# Patient Record
Sex: Male | Born: 1980 | Race: Black or African American | Hispanic: No | Marital: Married | Smoking: Never smoker
Health system: Southern US, Community
[De-identification: ages and names within clinical notes are randomized; demographics above are authoritative.]

---

## 2002-02-08 ENCOUNTER — Emergency Department (HOSPITAL_COMMUNITY): Admission: EM | Admit: 2002-02-08 | Discharge: 2002-02-08 | Payer: Self-pay | Admitting: Emergency Medicine

## 2008-01-25 ENCOUNTER — Emergency Department (HOSPITAL_COMMUNITY): Admission: EM | Admit: 2008-01-25 | Discharge: 2008-01-25 | Payer: Self-pay | Admitting: Emergency Medicine

## 2009-02-14 ENCOUNTER — Ambulatory Visit (HOSPITAL_BASED_OUTPATIENT_CLINIC_OR_DEPARTMENT_OTHER): Admission: RE | Admit: 2009-02-14 | Discharge: 2009-02-14 | Payer: Self-pay | Admitting: Urology

## 2011-01-13 ENCOUNTER — Inpatient Hospital Stay (INDEPENDENT_AMBULATORY_CARE_PROVIDER_SITE_OTHER)
Admission: RE | Admit: 2011-01-13 | Discharge: 2011-01-13 | Disposition: A | Discharge status: Home / Self Care | Payer: 59 | Source: Ambulatory Visit | Attending: Family Medicine | Admitting: Family Medicine

## 2011-01-13 DIAGNOSIS — K5289 Other specified noninfective gastroenteritis and colitis: Secondary | ICD-10-CM

## 2011-01-13 LAB — POCT I-STAT, CHEM 8
Calcium, Ion: 1.1 mmol/L — ABNORMAL LOW (ref 1.12–1.32)
Creatinine, Ser: 1.1 mg/dL (ref 0.4–1.5)
Glucose, Bld: 116 mg/dL — ABNORMAL HIGH (ref 70–99)
HCT: 51 % (ref 39.0–52.0)
Hemoglobin: 17.3 g/dL — ABNORMAL HIGH (ref 13.0–17.0)

## 2011-01-15 ENCOUNTER — Ambulatory Visit (HOSPITAL_COMMUNITY): Payer: 59

## 2011-01-15 ENCOUNTER — Inpatient Hospital Stay (HOSPITAL_COMMUNITY)
Admission: EM | Admit: 2011-01-15 | Discharge: 2011-01-18 | DRG: 282 | Disposition: A | Discharge status: Home / Self Care | Payer: 59 | Source: Ambulatory Visit | Attending: Cardiology | Admitting: Cardiology

## 2011-01-15 ENCOUNTER — Inpatient Hospital Stay (INDEPENDENT_AMBULATORY_CARE_PROVIDER_SITE_OTHER)
Admission: RE | Admit: 2011-01-15 | Discharge: 2011-01-15 | Disposition: A | Discharge status: Home / Self Care | Payer: 59 | Source: Ambulatory Visit

## 2011-01-15 ENCOUNTER — Encounter (HOSPITAL_COMMUNITY): Payer: Self-pay | Admitting: Radiology

## 2011-01-15 DIAGNOSIS — Z23 Encounter for immunization: Secondary | ICD-10-CM

## 2011-01-15 DIAGNOSIS — E876 Hypokalemia: Secondary | ICD-10-CM | POA: Diagnosis present

## 2011-01-15 DIAGNOSIS — Z79899 Other long term (current) drug therapy: Secondary | ICD-10-CM

## 2011-01-15 DIAGNOSIS — I2109 ST elevation (STEMI) myocardial infarction involving other coronary artery of anterior wall: Principal | ICD-10-CM | POA: Diagnosis present

## 2011-01-15 DIAGNOSIS — I209 Angina pectoris, unspecified: Secondary | ICD-10-CM | POA: Diagnosis present

## 2011-01-15 DIAGNOSIS — I219 Acute myocardial infarction, unspecified: Secondary | ICD-10-CM

## 2011-01-15 DIAGNOSIS — E785 Hyperlipidemia, unspecified: Secondary | ICD-10-CM | POA: Diagnosis present

## 2011-01-15 LAB — POCT I-STAT, CHEM 8
Calcium, Ion: 1.06 mmol/L — ABNORMAL LOW (ref 1.12–1.32)
Creatinine, Ser: 0.9 mg/dL (ref 0.4–1.5)
Glucose, Bld: 113 mg/dL — ABNORMAL HIGH (ref 70–99)
HCT: 40 % (ref 39.0–52.0)
Hemoglobin: 13.6 g/dL (ref 13.0–17.0)
TCO2: 25 mmol/L (ref 0–100)

## 2011-01-15 LAB — CARDIAC PANEL(CRET KIN+CKTOT+MB+TROPI)
CK, MB: 41.8 ng/mL (ref 0.3–4.0)
CK, MB: 56.5 ng/mL (ref 0.3–4.0)
Relative Index: 3.7 — ABNORMAL HIGH (ref 0.0–2.5)
Total CK: 1118 U/L — ABNORMAL HIGH (ref 7–232)
Total CK: 1139 U/L — ABNORMAL HIGH (ref 7–232)
Troponin I: 6.41 ng/mL (ref 0.00–0.06)

## 2011-01-15 LAB — CBC
HCT: 38.6 % — ABNORMAL LOW (ref 39.0–52.0)
Hemoglobin: 12.5 g/dL — ABNORMAL LOW (ref 13.0–17.0)
MCH: 22.4 pg — ABNORMAL LOW (ref 26.0–34.0)
MCHC: 32.4 g/dL (ref 30.0–36.0)
RDW: 14.9 % (ref 11.5–15.5)

## 2011-01-15 LAB — LIPID PANEL
Cholesterol: 106 mg/dL (ref 0–200)
LDL Cholesterol: 55 mg/dL (ref 0–99)

## 2011-01-15 LAB — COMPREHENSIVE METABOLIC PANEL
ALT: 33 U/L (ref 0–53)
Alkaline Phosphatase: 15 U/L — ABNORMAL LOW (ref 39–117)
BUN: 5 mg/dL — ABNORMAL LOW (ref 6–23)
CO2: 27 mEq/L (ref 19–32)
GFR calc non Af Amer: >60 mL/min (ref >60)
Glucose, Bld: 115 mg/dL — ABNORMAL HIGH (ref 70–99)
Potassium: 3 mEq/L — ABNORMAL LOW (ref 3.5–5.1)
Sodium: 136 mEq/L (ref 135–145)
Total Protein: 6.1 g/dL (ref 6.0–8.3)

## 2011-01-15 LAB — PROTIME-INR
INR: 1.07 (ref 0.00–1.49)
Prothrombin Time: 14.1 seconds (ref 11.6–15.2)

## 2011-01-15 MED ORDER — IOHEXOL 350 MG/ML SOLN
100.0000 mL | Freq: Once | INTRAVENOUS | Status: AC | PRN
  Administered 2011-01-15: 100 mL via INTRAVENOUS

## 2011-01-16 LAB — COMPREHENSIVE METABOLIC PANEL
Alkaline Phosphatase: 15 U/L — ABNORMAL LOW (ref 39–117)
BUN: 5 mg/dL — ABNORMAL LOW (ref 6–23)
Creatinine, Ser: 1.02 mg/dL (ref 0.4–1.5)
Glucose, Bld: 115 mg/dL — ABNORMAL HIGH (ref 70–99)
Potassium: 3.8 mEq/L (ref 3.5–5.1)
Total Bilirubin: 0.3 mg/dL (ref 0.3–1.2)
Total Protein: 6.1 g/dL (ref 6.0–8.3)

## 2011-01-16 LAB — LACTATE DEHYDROGENASE: LDH: 219 U/L (ref 94–250)

## 2011-01-16 LAB — CARDIAC PANEL(CRET KIN+CKTOT+MB+TROPI)
CK, MB: 35 ng/mL (ref 0.3–4.0)
CK, MB: 17.2 ng/mL (ref 0.3–4.0)
Relative Index: 2.5 (ref 0.0–2.5)
Total CK: 1006 U/L — ABNORMAL HIGH (ref 7–232)
Troponin I: 6.83 ng/mL (ref 0.00–0.06)

## 2011-01-16 LAB — CBC
HCT: 39.5 % (ref 39.0–52.0)
MCH: 22.2 pg — ABNORMAL LOW (ref 26.0–34.0)
MCHC: 31.6 g/dL (ref 30.0–36.0)
MCV: 70.2 fL — ABNORMAL LOW (ref 78.0–100.0)
RDW: 15 % (ref 11.5–15.5)
WBC: 4.7 10*3/uL (ref 4.0–10.5)

## 2011-01-16 LAB — TSH: TSH: 1.026 u[IU]/mL (ref 0.350–4.500)

## 2011-01-18 LAB — BASIC METABOLIC PANEL
Calcium: 9 mg/dL (ref 8.4–10.5)
Creatinine, Ser: 1.14 mg/dL (ref 0.4–1.5)
GFR calc Af Amer: >60 mL/min (ref >60)
Sodium: 143 mEq/L (ref 135–145)

## 2011-01-19 LAB — ALDOLASE: Aldolase: 9 U/L — ABNORMAL HIGH (ref <=8.1)

## 2011-01-20 NOTE — H&P (Signed)
  NAMEEBEN, CHOINSKI          ACCOUNT NO.:  [id number]  MEDICAL RECORD NO.:  [id number]           PATIENT TYPE:  O  LOCATION:  CATH                         FACILITY:  [facility]  PHYSICIAN:  Italy Hilty, MD         DATE OF BIRTH:  [birth year]  DATE OF ADMISSION:  01/15/2011 DATE OF DISCHARGE:                             HISTORY & PHYSICAL   PRIMARY CARE DOCTOR:  L. Lupe Carney, MD  HISTORY OF PRESENT ILLNESS:  Mr. Hissong is a 30 year old male with no prior history of coronary artery disease.  He presented to Urgent Care this morning complaining of a chest pain and burning that radiated to his back.  His EKG suggested some anterior ST changes, it was interpreted as an anterior ST elevation MI.  He is transferred urgently to the cath lab for catheterization.  The patient says they developed substernal chest burning this morning around 8 a.m. when he got up.  It radiates to his back but does not particularly go down in his arms or jaw.  It is not worse with deep inspiration or lying flat.  He denies any associated shortness of breath or diaphoresis.  PAST MEDICAL HISTORY:  Remarkable for dyslipidemia.  PAST SURGICAL HISTORY:  He has had no major surgeries.  There is no history of diabetes.  HOME MEDICATIONS:  Simvastatin.  ALLERGIES:  He has no known drug allergies.  SOCIAL HISTORY:  He is married, he has five children total.  He works in [employer].  He is a nonsmoker.  He denies drug use.  FAMILY HISTORY:  Remarkable that his father had some type of heart problems.  The patient thinks at an early age.  REVIEW OF SYSTEMS:  The patient admits that earlier this week, he had nausea and vomiting and was seen at Urgent Care Wednesday prior to this admission and given IV fluids.  PHYSICAL EXAMINATION:  VITAL SIGNS:  Blood pressure 132/85, pulse 92, respirations 16. GENERAL:  He is a well-developed, well-nourished [place] male, anxious but in no acute  distress. HEENT: Normocephalic, atraumatic.  Extraocular movements are intact. Sclerae nonicteric. NECK:  Without JVD or bruit. CHEST: Clear to auscultation and percussion. CARDIAC:  Regular rate and rhythm without obvious murmur or rub. ABDOMEN:  Not distended, nontender. EXTREMITIES:  Without edema. NEURO:  Grossly intact. SKIN:  Cool and dry.  LABORATORY DATA:  Labs are pending.  IMPRESSION: 1. ST elevation myocardial infarction. 2. Recent viral gastrointestinal syndrome. 3. Treated dyslipidemia.  PLAN:  The patient is taken urgently to the cath lab by Dr. Rennis Golden.     Abelino Derrick, P.A.   ______________________________ [place] Hilty, MD    LKK/MEDQ  D:  01/15/2011  T:  01/15/2011  Job:  [id number]  Electronically Signed by Corine Shelter P.A. on 01/18/2011 12:25:34 PM Electronically Signed by Kirtland Bouchard. HILTY M.D. on 01/20/2011 08:11:31 AM

## 2011-01-20 NOTE — Discharge Summary (Signed)
  Willie Bonilla, Willie Bonilla          ACCOUNT NO.:  [id number]  MEDICAL RECORD NO.:  [id number]           PATIENT TYPE:  O  LOCATION:  2013                         FACILITY:  [facility]  PHYSICIAN:  Italy Miriah Maruyama, MD         DATE OF BIRTH:  09/29/1981  DATE OF ADMISSION:  01/15/2011 DATE OF DISCHARGE:  01/18/2011                              DISCHARGE SUMMARY   ADDENDUM  DISPOSITION:  The patient has been seen by Dr. Rennis Golden who feels he is stable for discharge on January 18, 2011, with repeat CMP and cardiac enzymes prior to followup in approximately 1 week.  He will be going home on aspirin, Lopressor, and Imdur.    ______________________________ Wilburt Finlay, PA   ______________________________ [place] Yohanna Tow, MD    BH/MEDQ  D:  01/18/2011  T:  01/19/2011  Job:  [id number]  Electronically Signed by Wilburt Finlay PA on 01/19/2011 03:10:58 PM Electronically Signed by Kirtland Bouchard. Vivan Vanderveer M.D. on 01/20/2011 08:13:01 AM

## 2011-01-20 NOTE — Procedures (Signed)
  Willie Bonilla, Willie Bonilla          ACCOUNT NO.:  [id number]  MEDICAL RECORD NO.:  [id number]           PATIENT TYPE:  O  LOCATION:  2013                         FACILITY:  [facility]  PHYSICIAN:  [place]aly Hilty, MD         DATE OF BIRTH:  11-05-1981  DATE OF PROCEDURE:  01/15/2011 DATE OF DISCHARGE:                           CARDIAC CATHETERIZATION   PROCEDURE:  Left heart catheterization for suspected ST-elevation myocardial infarction.  INDICATIONS:  Chest pain with evidence for 1-2 mm ST elevation at the J- point in V6, lead I, and aVL and 1 mm ST depression in leads III and aVF.  OPERATOR:  [place] Hilty, MD (Dr. Onnie Graham was in the middle of the intervention in another lab and I was asked to emergently perform the diagnostic portion of the STEMI case).  Door to needle time was less than 30 minutes.  PROCEDURE:  The patient was brought to the Cardiac Catheterization room after informed consent was obtained.  The patient was sterilely prepped and draped in the usual fashion.  He was given 2 mg of Versed and 50 mcg fentanyl for moderate sedation.  After quick procedural time-out and the radiation safety time-out, the area around the right femoral artery was anesthetized with 5 mL of 1% lidocaine.  The right femoral artery was accessed with straight needle and wire, and a 6-French right femoral access catheter was placed through which subsequent cardiac catheterization was performed with 6-French JL-4 and JR-4 as well as the no-torque catheter.  There were no acute complications and estimated blood loss was less than 10 mL.  FINDINGS:  Angiographically normal coronary arteries.  IMPRESSION: 1. Angiographically normal coronary arteries - this was not a ST-     elevation myocardial infarction. 2. Suspect early repolarization changes vs. coronary artery spasm.  PLAN:  Mr. Swatzell has had a recent upper respiratory infection with vomiting and I suspect may have perhaps chest  pain related to esophageal irritation or perhaps the chest wall pain secondary to viral illness, however, does not appear to be cardiac in nature.  His EKG changes are most likely consistent with early repolarization, however, could be due to coronary spasm.     [place] Hilty, MD    CH/MEDQ  D:  01/15/2011  T:  01/16/2011  Job:  [id number]  Electronically Signed by K. HILTY M.D. on 01/20/2011 08:12:55 AM

## 2011-02-01 ENCOUNTER — Encounter (HOSPITAL_COMMUNITY): Payer: 59

## 2011-02-03 ENCOUNTER — Encounter (HOSPITAL_COMMUNITY): Payer: 59

## 2011-02-05 ENCOUNTER — Encounter (HOSPITAL_COMMUNITY): Payer: 59

## 2011-02-08 ENCOUNTER — Encounter (HOSPITAL_COMMUNITY): Payer: 59

## 2011-02-10 ENCOUNTER — Encounter (HOSPITAL_COMMUNITY): Payer: 59 | Attending: Internal Medicine

## 2011-02-10 DIAGNOSIS — Z5189 Encounter for other specified aftercare: Secondary | ICD-10-CM | POA: Insufficient documentation

## 2011-02-10 DIAGNOSIS — I2109 ST elevation (STEMI) myocardial infarction involving other coronary artery of anterior wall: Secondary | ICD-10-CM | POA: Insufficient documentation

## 2011-02-10 DIAGNOSIS — I209 Angina pectoris, unspecified: Secondary | ICD-10-CM | POA: Insufficient documentation

## 2011-02-10 DIAGNOSIS — Z79899 Other long term (current) drug therapy: Secondary | ICD-10-CM | POA: Insufficient documentation

## 2011-02-10 DIAGNOSIS — E785 Hyperlipidemia, unspecified: Secondary | ICD-10-CM | POA: Insufficient documentation

## 2011-02-12 ENCOUNTER — Encounter (HOSPITAL_COMMUNITY): Payer: 59

## 2011-02-15 ENCOUNTER — Encounter (HOSPITAL_COMMUNITY): Payer: 59

## 2011-02-17 ENCOUNTER — Encounter (HOSPITAL_COMMUNITY): Payer: 59

## 2011-02-17 LAB — POCT HEMOGLOBIN-HEMACUE: Hemoglobin: 13.8 g/dL (ref 13.0–17.0)

## 2011-02-19 ENCOUNTER — Encounter (HOSPITAL_COMMUNITY): Payer: 59

## 2011-02-22 ENCOUNTER — Encounter (HOSPITAL_COMMUNITY): Payer: 59

## 2011-02-24 ENCOUNTER — Encounter (HOSPITAL_COMMUNITY): Payer: 59

## 2011-02-26 ENCOUNTER — Encounter (HOSPITAL_COMMUNITY): Payer: 59

## 2011-03-01 ENCOUNTER — Encounter (HOSPITAL_COMMUNITY): Payer: 59

## 2011-03-03 ENCOUNTER — Encounter (HOSPITAL_COMMUNITY): Payer: 59

## 2011-03-05 ENCOUNTER — Encounter (HOSPITAL_COMMUNITY): Payer: 59

## 2011-03-08 ENCOUNTER — Encounter (HOSPITAL_COMMUNITY): Payer: 59

## 2011-03-10 ENCOUNTER — Encounter (HOSPITAL_COMMUNITY): Payer: 59 | Attending: Internal Medicine

## 2011-03-10 DIAGNOSIS — I209 Angina pectoris, unspecified: Secondary | ICD-10-CM | POA: Insufficient documentation

## 2011-03-10 DIAGNOSIS — I2109 ST elevation (STEMI) myocardial infarction involving other coronary artery of anterior wall: Secondary | ICD-10-CM | POA: Insufficient documentation

## 2011-03-10 DIAGNOSIS — Z79899 Other long term (current) drug therapy: Secondary | ICD-10-CM | POA: Insufficient documentation

## 2011-03-10 DIAGNOSIS — E785 Hyperlipidemia, unspecified: Secondary | ICD-10-CM | POA: Insufficient documentation

## 2011-03-10 DIAGNOSIS — Z5189 Encounter for other specified aftercare: Secondary | ICD-10-CM | POA: Insufficient documentation

## 2011-03-12 ENCOUNTER — Encounter (HOSPITAL_COMMUNITY): Payer: 59

## 2011-03-15 ENCOUNTER — Encounter (HOSPITAL_COMMUNITY): Payer: 59

## 2011-03-17 ENCOUNTER — Encounter (HOSPITAL_COMMUNITY): Payer: 59

## 2011-03-19 ENCOUNTER — Encounter (HOSPITAL_COMMUNITY): Payer: 59

## 2011-03-22 ENCOUNTER — Encounter (HOSPITAL_COMMUNITY): Payer: 59

## 2011-03-23 NOTE — Op Note (Signed)
Willie Bonilla, Willie Bonilla          ACCOUNT NO.:  [id number]   MEDICAL RECORD NO.:  [id number]          PATIENT TYPE:  AMB   LOCATION:  [facility]                         FACILITY:  [facility]   PHYSICIAN:  Maretta Bees. Vonita Moss, M.D.DATE OF BIRTH:  06/21/1981   DATE OF PROCEDURE:  02/14/2009  DATE OF DISCHARGE:                               OPERATIVE REPORT   PREOPERATIVE DIAGNOSIS:  Phimosis.   POSTOP DIAGNOSIS:  Phimosis.   PROCEDURE:  Circumcision.   SURGEON:  Dr. Larey Dresser   ANESTHESIA:  General.   INDICATIONS:  This gentleman has had several years of cracking and  splitting of the foreskin and was counseled and understood the need for  circumcision to prevent this problem from progressing in the future.   He was advised about bleeding, bruising and discomfort postop.   PROCEDURE:  The patient was brought to the operating room, placed in  supine position.  External genitalia were prepped and draped in usual  fashion.  Circumcision was performed using the sleeve technique.  Hemostasis was obtained by the use of electrocautery and catgut ties.  The frenulum area was closed with running 4-0 chromic catgut and  quadrant sutures were placed of 4-0 chromic catgut at 3, 6, 9 and 12  o'clock.  I then injected some Marcaine proximally for local analgesia  postop.  Running 4-0 chromic catgut was placed between these quadrant  sutures.  The wound was cleaned with dry sterile gauze dressings,  including a Vaseline gauze and Coban.  Blood loss was negligible.  He  tolerated the procedure well.      Maretta Bees. Vonita Moss, M.D.  Electronically Signed     LJP/MEDQ  D:  02/14/2009  T:  02/14/2009  Job:  [id number]

## 2011-03-24 ENCOUNTER — Encounter (HOSPITAL_COMMUNITY): Payer: 59

## 2011-03-26 ENCOUNTER — Encounter (HOSPITAL_COMMUNITY): Payer: 59

## 2011-03-29 ENCOUNTER — Encounter (HOSPITAL_COMMUNITY): Payer: 59

## 2011-03-31 ENCOUNTER — Encounter (HOSPITAL_COMMUNITY): Payer: 59

## 2011-04-02 ENCOUNTER — Encounter (HOSPITAL_COMMUNITY): Payer: 59

## 2011-04-05 ENCOUNTER — Encounter (HOSPITAL_COMMUNITY): Payer: 59

## 2011-04-07 ENCOUNTER — Encounter (HOSPITAL_COMMUNITY): Payer: 59

## 2011-04-09 ENCOUNTER — Encounter (HOSPITAL_COMMUNITY): Payer: 59

## 2011-04-12 ENCOUNTER — Encounter (HOSPITAL_COMMUNITY): Payer: 59

## 2011-04-14 ENCOUNTER — Encounter (HOSPITAL_COMMUNITY): Payer: 59

## 2011-04-16 ENCOUNTER — Encounter (HOSPITAL_COMMUNITY): Payer: 59 | Attending: Internal Medicine

## 2011-04-16 DIAGNOSIS — Z79899 Other long term (current) drug therapy: Secondary | ICD-10-CM | POA: Insufficient documentation

## 2011-04-16 DIAGNOSIS — E785 Hyperlipidemia, unspecified: Secondary | ICD-10-CM | POA: Insufficient documentation

## 2011-04-16 DIAGNOSIS — I209 Angina pectoris, unspecified: Secondary | ICD-10-CM | POA: Insufficient documentation

## 2011-04-16 DIAGNOSIS — Z5189 Encounter for other specified aftercare: Secondary | ICD-10-CM | POA: Insufficient documentation

## 2011-04-16 DIAGNOSIS — I2109 ST elevation (STEMI) myocardial infarction involving other coronary artery of anterior wall: Secondary | ICD-10-CM | POA: Insufficient documentation

## 2011-04-19 ENCOUNTER — Encounter (HOSPITAL_COMMUNITY): Payer: 59

## 2011-04-21 ENCOUNTER — Encounter (HOSPITAL_COMMUNITY): Payer: 59

## 2011-04-23 ENCOUNTER — Encounter (HOSPITAL_COMMUNITY): Payer: 59

## 2011-04-26 ENCOUNTER — Encounter (HOSPITAL_COMMUNITY): Payer: 59

## 2011-04-28 ENCOUNTER — Encounter (HOSPITAL_COMMUNITY): Payer: 59

## 2011-04-30 ENCOUNTER — Encounter (HOSPITAL_COMMUNITY): Payer: 59

## 2011-05-03 ENCOUNTER — Encounter (HOSPITAL_COMMUNITY): Payer: 59

## 2011-05-05 ENCOUNTER — Encounter (HOSPITAL_COMMUNITY): Payer: 59

## 2011-05-07 ENCOUNTER — Encounter (HOSPITAL_COMMUNITY): Payer: 59

## 2011-05-10 ENCOUNTER — Ambulatory Visit (HOSPITAL_COMMUNITY): Payer: 59

## 2011-05-12 ENCOUNTER — Ambulatory Visit (HOSPITAL_COMMUNITY): Payer: 59

## 2011-05-14 ENCOUNTER — Ambulatory Visit (HOSPITAL_COMMUNITY): Payer: 59

## 2014-08-06 ENCOUNTER — Ambulatory Visit (HOSPITAL_COMMUNITY): Payer: 59 | Attending: Family Medicine

## 2014-08-06 ENCOUNTER — Encounter (HOSPITAL_COMMUNITY): Payer: Self-pay | Admitting: Emergency Medicine

## 2014-08-06 ENCOUNTER — Emergency Department (INDEPENDENT_AMBULATORY_CARE_PROVIDER_SITE_OTHER)
Admission: EM | Admit: 2014-08-06 | Discharge: 2014-08-06 | Disposition: A | Discharge status: Home / Self Care | Payer: 59 | Source: Home / Self Care | Attending: Family Medicine | Admitting: Family Medicine

## 2014-08-06 DIAGNOSIS — R05 Cough: Secondary | ICD-10-CM | POA: Diagnosis not present

## 2014-08-06 DIAGNOSIS — R071 Chest pain on breathing: Secondary | ICD-10-CM | POA: Insufficient documentation

## 2014-08-06 DIAGNOSIS — R059 Cough, unspecified: Secondary | ICD-10-CM | POA: Insufficient documentation

## 2014-08-06 DIAGNOSIS — R0789 Other chest pain: Secondary | ICD-10-CM | POA: Diagnosis present

## 2014-08-06 DIAGNOSIS — K209 Esophagitis, unspecified without bleeding: Secondary | ICD-10-CM

## 2014-08-06 MED ORDER — OMEPRAZOLE 40 MG PO CPDR: 40.0000 mg | DELAYED_RELEASE_CAPSULE | Freq: Every day | ORAL | Status: DC

## 2014-08-06 MED ORDER — GI COCKTAIL ~~LOC~~
30.0000 mL | Freq: Once | ORAL | Status: AC
  Administered 2014-08-06: 30 mL via ORAL

## 2014-08-06 MED ORDER — GI COCKTAIL ~~LOC~~
ORAL | Status: AC
Start: 2014-08-06 — End: 2014-08-06
  Filled 2014-08-06: qty 30

## 2014-08-06 MED ORDER — SUCRALFATE 1 G PO TABS: 1.0000 g | ORAL_TABLET | Freq: Three times a day (TID) | ORAL | Status: DC

## 2014-08-06 NOTE — Discharge Instructions (Signed)
Thank you for coming in today. Take omeprazole daily. Take Carafate with meals 4 times daily.  Followup with primary care provider Call or go to the emergency room if you get worse, have trouble breathing, have chest pains, or palpitations.    Esophagitis Esophagitis is inflammation of the esophagus. It can involve swelling, soreness, and pain in the esophagus. This condition can make it difficult and painful to swallow. CAUSES  Most causes of esophagitis are not serious. Many different factors can cause esophagitis, including:  Gastroesophageal reflux disease (GERD). This is when acid from your stomach flows up into the esophagus.  Recurrent vomiting.  An allergic-type reaction.  Certain medicines, especially those that come in large pills.  Ingestion of harmful chemicals, such as household cleaning products.  Heavy alcohol use.  An infection of the esophagus.  Radiation treatment for cancer.  Certain diseases such as sarcoidosis, Crohn's disease, and scleroderma. These diseases may cause recurrent esophagitis. SYMPTOMS   Trouble swallowing.  Painful swallowing.  Chest pain.  Difficulty breathing.  Nausea.  Vomiting.  Abdominal pain. DIAGNOSIS  Your caregiver will take your history and do a physical exam. Depending upon what your caregiver finds, certain tests may also be done, including:  Barium X-ray. You will drink a solution that coats the esophagus, and X-rays will be taken.  Endoscopy. A lighted tube is put down the esophagus so your caregiver can examine the area.  Allergy tests. These can sometimes be arranged through follow-up visits. TREATMENT  Treatment will depend on the cause of your esophagitis. In some cases, steroids or other medicines may be given to help relieve your symptoms or to treat the underlying cause of your condition. Medicines that may be recommended include:  Viscous lidocaine, to soothe the esophagus.  Antacids.  Acid  reducers.  Proton pump inhibitors.  Antiviral medicines for certain viral infections of the esophagus.  Antifungal medicines for certain fungal infections of the esophagus.  Antibiotic medicines, depending on the cause of the esophagitis. HOME CARE INSTRUCTIONS   Avoid foods and drinks that seem to make your symptoms worse.  Eat small, frequent meals instead of large meals.  Avoid eating for the 3 hours prior to your bedtime.  If you have trouble taking pills, use a pill splitter to decrease the size and likelihood of the pill getting stuck or injuring the esophagus on the way down. Drinking water after taking a pill also helps.  Stop smoking if you smoke.  Maintain a healthy weight.  Wear loose-fitting clothing. Do not wear anything tight around your waist that causes pressure on your stomach.  Raise the head of your bed 6 to 8 inches with wood blocks to help you sleep. Extra pillows will not help.  Only take over-the-counter or prescription medicines as directed by your caregiver. SEEK IMMEDIATE MEDICAL CARE IF:  You have severe chest pain that radiates into your arm, neck, or jaw.  You feel sweaty, dizzy, or lightheaded.  You have shortness of breath.  You vomit blood.  You have difficulty or pain with swallowing.  You have bloody or black, tarry stools.  You have a fever.  You have a burning sensation in the chest more than 3 times a week for more than 2 weeks.  You cannot swallow, drink, or eat.  You drool because you cannot swallow your saliva. MAKE SURE YOU:  Understand these instructions.  Will watch your condition.  Will get help right away if you are not doing well or get worse.  Document Released: 12/02/2004 Document Revised: 01/17/2012 Document Reviewed: 06/25/2011 [facility] Patient Information 2015 [place], [place]. This information is not intended to replace advice given to you by your health care provider. Make sure you discuss any questions you  have with your health care provider.

## 2014-08-06 NOTE — ED Provider Notes (Signed)
Willie Bonilla is a 33 y.o. male who presents to Urgent Care today for chest pain. 2 days ago patient had what he believes was a stomach virus resulting in nausea and vomiting. He continued to be nauseated yesterday and into today a bit. However his main complaint is pain in his chest. He notes sharp chest pain that is present with coughing and chest motion. He denies any radiating or exertional component. No palpitations. He has tried Alka-Seltzer which has not helped. He does have a history of cardiac catheterization. In 2012 he had a chest pain episode with a cardiac catheterization showing clear coronary vessels. He was thought to have coronary artery spasm.  His current pain is significantly different from his previous pain.   History reviewed. No pertinent past medical history. History  Substance Use Topics  . Smoking status: Never Smoker   . Smokeless tobacco: Not on file  . Alcohol Use: Yes   ROS as above Medications: No current facility-administered medications for this encounter.   Current Outpatient Prescriptions  Medication Sig Dispense Refill  . omeprazole (PRILOSEC) 40 MG capsule Take 1 capsule (40 mg total) by mouth daily.  30 capsule  1  . sucralfate (CARAFATE) 1 G tablet Take 1 tablet (1 g total) by mouth 4 (four) times daily -  with meals and at bedtime.  60 tablet  0    Exam:  BP 142/85  Pulse 85  Temp(Src) 98.1 F (36.7 C) (Oral)  Resp 16  SpO2 96% Gen: Well NAD nontoxic appearing HEENT: EOMI,  MMM Lungs: Normal work of breathing. CTABL Heart: RRR no MRG Chest wall: Nontender Abd: NABS, Soft. Nondistended, Nontender Exts: Brisk capillary refill, warm and well perfused.   Patient was given a GI cocktail, and felt a lot better  Twelve-lead EKG shows normal sinus rhythm at 71 beats per minute. No ST segment elevation or depression. Normal QRS.  No results found for this or any previous visit (from the past 24 hour(s)). Dg Chest 2 View  08/06/2014    CLINICAL DATA:  Mid chest pain with productive cough for 2 days. Pleuritic chest pain. No fever.  EXAM: CHEST  2 VIEW  COMPARISON:  Radiographs 01/25/2008 and 01/15/2011.  CT 01/15/2011.  FINDINGS: The heart size and mediastinal contours are stable. There is stable mild prominence of the bronchovascular markings. No confluent airspace opacity, edema or pleural effusion is present. The osseous structures appear normal.  IMPRESSION: Stable chest with mild chronic prominence of the bronchovascular markings. No acute cardiopulmonary process.   Electronically Signed   By: Roxy HorsemanBill  Veazey M.D.   On: 08/06/2014 17:02    Assessment and Plan: 33 y.o. male with chest pain. I suspect the pain is esophagitis after vomiting 2 days ago. He does have a history of a clean coronary catheterization 3 years ago which decreases the probability of myocardium infarction today. Additionally his chest x-ray is essentially normal decreasing chances for pneumothorax or effusion or mediastinal infection secondary to Boerhaave's syndrome or Mallory-Weiss Tear.    Plan to treat with omeprazole and Carafate. Prompt followup with the emergency department if not improving or worsening. Followup with PCP for further evaluation and management.  Discussed warning signs or symptoms. Please see discharge instructions. Patient expresses understanding.     Rodolph BongEvan S Marlen Mollica, MD 08/06/14 [phone]

## 2014-08-06 NOTE — ED Notes (Signed)
Reports stomach virus on Sunday with n/v.  Pt c/o having pain in chest and back with movement and cough.  Denies fever and diarrhea.   No relief with otc meds tums and alka selza.

## 2015-04-18 ENCOUNTER — Ambulatory Visit
Admission: RE | Admit: 2015-04-18 | Discharge: 2015-04-18 | Disposition: A | Discharge status: Home / Self Care | Payer: 59 | Source: Ambulatory Visit | Attending: Family Medicine | Admitting: Family Medicine

## 2015-04-18 ENCOUNTER — Other Ambulatory Visit: Payer: Self-pay | Admitting: Family Medicine

## 2015-04-18 DIAGNOSIS — M549 Dorsalgia, unspecified: Secondary | ICD-10-CM

## 2015-06-02 ENCOUNTER — Encounter (HOSPITAL_COMMUNITY): Payer: Self-pay | Admitting: Emergency Medicine

## 2015-06-02 ENCOUNTER — Emergency Department (INDEPENDENT_AMBULATORY_CARE_PROVIDER_SITE_OTHER)
Admission: EM | Admit: 2015-06-02 | Discharge: 2015-06-02 | Disposition: A | Discharge status: Home / Self Care | Payer: 59 | Source: Home / Self Care | Attending: Family Medicine | Admitting: Family Medicine

## 2015-06-02 DIAGNOSIS — J069 Acute upper respiratory infection, unspecified: Secondary | ICD-10-CM

## 2015-06-02 MED ORDER — IPRATROPIUM BROMIDE 0.06 % NA SOLN: 2.0000 | Freq: Four times a day (QID) | NASAL | Status: DC

## 2015-06-02 NOTE — Discharge Instructions (Signed)
Upper Respiratory Infection, Adult Allegra or Claritin for drainage Stop the alkaseltzer cold plus, too much decongestant for your heart Flonase nasal spray Lots of saline nasal spray May try Sudafed Pe 10 every 6 hours for congestion atrovent nasal spray  An upper respiratory infection (URI) is also sometimes known as the common cold. The upper respiratory tract includes the nose, sinuses, throat, trachea, and bronchi. Bronchi are the airways leading to the lungs. Most people improve within 1 week, but symptoms can last up to 2 weeks. A residual cough may last even longer.  CAUSES Many different viruses can infect the tissues lining the upper respiratory tract. The tissues become irritated and inflamed and often become very moist. Mucus production is also common. A cold is contagious. You can easily spread the virus to others by oral contact. This includes kissing, sharing a glass, coughing, or sneezing. Touching your mouth or nose and then touching a surface, which is then touched by another person, can also spread the virus. SYMPTOMS  Symptoms typically develop 1 to 3 days after you come in contact with a cold virus. Symptoms vary from person to person. They may include:  Runny nose.  Sneezing.  Nasal congestion.  Sinus irritation.  Sore throat.  Loss of voice (laryngitis).  Cough.  Fatigue.  Muscle aches.  Loss of appetite.  Headache.  Low-grade fever. DIAGNOSIS  You might diagnose your own cold based on familiar symptoms, since most people get a cold 2 to 3 times a year. Your caregiver can confirm this based on your exam. Most importantly, your caregiver can check that your symptoms are not due to another disease such as strep throat, sinusitis, pneumonia, asthma, or epiglottitis. Blood tests, throat tests, and X-rays are not necessary to diagnose a common cold, but they may sometimes be helpful in excluding other more serious diseases. Your caregiver will decide if any  further tests are required. RISKS AND COMPLICATIONS  You may be at risk for a more severe case of the common cold if you smoke cigarettes, have chronic heart disease (such as heart failure) or lung disease (such as asthma), or if you have a weakened immune system. The very young and very old are also at risk for more serious infections. Bacterial sinusitis, middle ear infections, and bacterial pneumonia can complicate the common cold. The common cold can worsen asthma and chronic obstructive pulmonary disease (COPD). Sometimes, these complications can require emergency medical care and may be life-threatening. PREVENTION  The best way to protect against getting a cold is to practice good hygiene. Avoid oral or hand contact with people with cold symptoms. Wash your hands often if contact occurs. There is no clear evidence that vitamin C, vitamin E, echinacea, or exercise reduces the chance of developing a cold. However, it is always recommended to get plenty of rest and practice good nutrition. TREATMENT  Treatment is directed at relieving symptoms. There is no cure. Antibiotics are not effective, because the infection is caused by a virus, not by bacteria. Treatment may include:  Increased fluid intake. Sports drinks offer valuable electrolytes, sugars, and fluids.  Breathing heated mist or steam (vaporizer or shower).  Eating chicken soup or other clear broths, and maintaining good nutrition.  Getting plenty of rest.  Using gargles or lozenges for comfort.  Controlling fevers with ibuprofen or acetaminophen as directed by your caregiver.  Increasing usage of your inhaler if you have asthma. Zinc gel and zinc lozenges, taken in the first 24 hours of the  common cold, can shorten the duration and lessen the severity of symptoms. Pain medicines may help with fever, muscle aches, and throat pain. A variety of non-prescription medicines are available to treat congestion and runny nose. Your caregiver  can make recommendations and may suggest nasal or lung inhalers for other symptoms.  HOME CARE INSTRUCTIONS   Only take over-the-counter or prescription medicines for pain, discomfort, or fever as directed by your caregiver.  Use a warm mist humidifier or inhale steam from a shower to increase air moisture. This may keep secretions moist and make it easier to breathe.  Drink enough water and fluids to keep your urine clear or pale yellow.  Rest as needed.  Return to work when your temperature has returned to normal or as your caregiver advises. You may need to stay home longer to avoid infecting others. You can also use a face mask and careful hand washing to prevent spread of the virus. SEEK MEDICAL CARE IF:   After the first few days, you feel you are getting worse rather than better.  You need your caregiver's advice about medicines to control symptoms.  You develop chills, worsening shortness of breath, or brown or red sputum. These may be signs of pneumonia.  You develop yellow or brown nasal discharge or pain in the face, especially when you bend forward. These may be signs of sinusitis.  You develop a fever, swollen neck glands, pain with swallowing, or white areas in the back of your throat. These may be signs of strep throat. SEEK IMMEDIATE MEDICAL CARE IF:   You have a fever.  You develop severe or persistent headache, ear pain, sinus pain, or chest pain.  You develop wheezing, a prolonged cough, cough up blood, or have a change in your usual mucus (if you have chronic lung disease).  You develop sore muscles or a stiff neck. Document Released: 04/20/2001 Document Revised: 01/17/2012 Document Reviewed: 01/30/2014 [facility] Patient Information 2015 [place], [place]. This information is not intended to replace advice given to you by your health care provider. Make sure you discuss any questions you have with your health care provider.

## 2015-06-02 NOTE — ED Provider Notes (Signed)
CSN: [id number]     Arrival date & time 06/02/15  1300 History   First MD Initiated Contact with Patient 06/02/15 1318     No chief complaint on file.  (Consider location/radiation/quality/duration/timing/severity/associated sxs/prior Treatment) HPI Comments: Cold symptoms for a day   No past medical history on file. No past surgical history on file. No family history on file. History  Substance Use Topics  . Smoking status: Never Smoker   . Smokeless tobacco: Not on file  . Alcohol Use: Yes    Review of Systems  Constitutional: Positive for activity change. Negative for fever, diaphoresis and fatigue.  HENT: Positive for congestion, postnasal drip, rhinorrhea and sore throat. Negative for ear pain, facial swelling and trouble swallowing.   Eyes: Negative for pain, discharge and redness.  Respiratory: Positive for cough. Negative for chest tightness and shortness of breath.   Cardiovascular: Negative.  Negative for chest pain, palpitations and leg swelling.  Gastrointestinal: Positive for nausea. Negative for abdominal pain.  Musculoskeletal: Negative.  Negative for neck pain and neck stiffness.  Neurological: Positive for headaches. Negative for speech difficulty.    Allergies  Review of patient's allergies indicates no known allergies.  Home Medications   Prior to Admission medications   Medication Sig Start Date End Date Taking? Authorizing Provider  ipratropium (ATROVENT) 0.06 % nasal spray Place 2 sprays into both nostrils 4 (four) times daily. 06/02/15   Hayden Rasmussen, NP  omeprazole (PRILOSEC) 40 MG capsule Take 1 capsule (40 mg total) by mouth daily. 08/06/14   Rodolph Bong, MD  sucralfate (CARAFATE) 1 G tablet Take 1 tablet (1 g total) by mouth 4 (four) times daily -  with meals and at bedtime. 08/06/14   Rodolph Bong, MD   BP 154/95 mmHg  Pulse 107  Temp(Src) 98.6 F (37 C) (Oral)  Resp 20  SpO2 99% Physical Exam  Constitutional: He is oriented to person, place,  and time. He appears well-developed and well-nourished. No distress.  HENT:  Bilateral TM's nl OP mild erythema, scant PND, no exudates  Eyes: Conjunctivae and EOM are normal.  Neck: Normal range of motion. Neck supple.  Cardiovascular: Regular rhythm.   Tachycardia, pounding apical pulse.  Pulmonary/Chest: Effort normal and breath sounds normal. No respiratory distress. He has no wheezes.  Musculoskeletal: Normal range of motion. He exhibits no edema.  Lymphadenopathy:    He has no cervical adenopathy.  Neurological: He is alert and oriented to person, place, and time.  Skin: Skin is warm and dry. No rash noted.  Psychiatric: He has a normal mood and affect.  Nursing note and vitals reviewed.   ED Course  Procedures (including critical care time) Labs Review Labs Reviewed - No data to display  Imaging Review No results found.   MDM   1. URI (upper respiratory infection)    Upper Respiratory Infection, Adult Allegra or Claritin for drainage Stop the alkaseltzer cold plus, too much decongestant for your heart Flonase nasal spray Lots of saline nasal spray May try Sudafed Pe 10 every 6 hours for congestion    Hayden Rasmussen, NP 06/02/15 1333

## 2015-06-02 NOTE — ED Notes (Signed)
C/o cold symptoms, onset 7/24.  Has used otc cold remedies

## 2016-11-14 ENCOUNTER — Ambulatory Visit (HOSPITAL_COMMUNITY)
Admission: EM | Admit: 2016-11-14 | Discharge: 2016-11-14 | Disposition: A | Discharge status: Home / Self Care | Payer: Managed Care, Other (non HMO) | Attending: Family Medicine | Admitting: Family Medicine

## 2016-11-14 ENCOUNTER — Encounter (HOSPITAL_COMMUNITY): Payer: Self-pay | Admitting: *Deleted

## 2016-11-14 DIAGNOSIS — R05 Cough: Secondary | ICD-10-CM | POA: Diagnosis not present

## 2016-11-14 DIAGNOSIS — J029 Acute pharyngitis, unspecified: Secondary | ICD-10-CM | POA: Diagnosis not present

## 2016-11-14 DIAGNOSIS — J00 Acute nasopharyngitis [common cold]: Secondary | ICD-10-CM

## 2016-11-14 DIAGNOSIS — R059 Cough, unspecified: Secondary | ICD-10-CM

## 2016-11-14 MED ORDER — BENZONATATE 100 MG PO CAPS: 200.0000 mg | ORAL_CAPSULE | Freq: Three times a day (TID) | ORAL | 0 refills | Status: DC | PRN

## 2016-11-14 MED ORDER — AZITHROMYCIN 250 MG PO TABS: 250.0000 mg | ORAL_TABLET | Freq: Every day | ORAL | 0 refills | Status: DC

## 2016-11-14 NOTE — ED Triage Notes (Signed)
C/O severe sore throat, HA, body aches, chills, throat "tightness", productive cough with painful chest when coughing, nasal pressure x 4 days.  Has been taking Nyquil and Advil.  No meds today.

## 2016-11-14 NOTE — ED Provider Notes (Signed)
CSN: [id number]     Arrival date & time 11/14/16  1625 History   First MD Initiated Contact with Patient 11/14/16 1708     Chief Complaint  Patient presents with  . Sore Throat  . Chills   (Consider location/radiation/quality/duration/timing/severity/associated sxs/prior Treatment) Patient c/o sore throat, cough, and uri sx's x 4 days    Sore Throat  This is a new problem. The current episode started more than 2 days ago. The problem occurs constantly. Nothing aggravates the symptoms.    History reviewed. No pertinent past medical history. History reviewed. No pertinent surgical history. No family history on file. Social History  Substance Use Topics  . Smoking status: Never Smoker  . Smokeless tobacco: Not on file  . Alcohol use Yes     Comment: occasional    Review of Systems  Constitutional: Negative.   HENT: Positive for congestion and sore throat.   Eyes: Negative.   Respiratory: Positive for cough.   Endocrine: Negative.   Genitourinary: Negative.   Musculoskeletal: Negative.   Allergic/Immunologic: Negative.   Neurological: Negative.   Hematological: Negative.   Psychiatric/Behavioral: Negative.     Allergies  Patient has no known allergies.  Home Medications   Prior to Admission medications   Medication Sig Start Date End Date Taking? Authorizing Provider  azithromycin (ZITHROMAX) 250 MG tablet Take 1 tablet (250 mg total) by mouth daily. Take first 2 tablets together, then 1 every day until finished. 11/14/16   Deatra CanterWilliam J Oxford, FNP  benzonatate (TESSALON) 100 MG capsule Take 2 capsules (200 mg total) by mouth 3 (three) times daily as needed for cough. 11/14/16   Deatra CanterWilliam J Oxford, FNP  ipratropium (ATROVENT) 0.06 % nasal spray Place 2 sprays into both nostrils 4 (four) times daily. 06/02/15   Hayden Rasmussenavid Mabe, NP  omeprazole (PRILOSEC) 40 MG capsule Take 1 capsule (40 mg total) by mouth daily. 08/06/14   Rodolph BongEvan S Corey, MD  sucralfate (CARAFATE) 1 G tablet Take 1 tablet  (1 g total) by mouth 4 (four) times daily -  with meals and at bedtime. 08/06/14   Rodolph BongEvan S Corey, MD   Meds Ordered and Administered this Visit  Medications - No data to display  BP 140/90   Pulse 118   Temp 99.8 F (37.7 C) (Oral)   Resp 18   SpO2 100%  No data found.   Physical Exam  Constitutional: He appears well-developed and well-nourished.  HENT:  Head: Normocephalic and atraumatic.  Right Ear: External ear normal.  Left Ear: External ear normal.  Opx erythematous  Eyes: Conjunctivae and EOM are normal. Pupils are equal, round, and reactive to light.  Neck: Normal range of motion. Neck supple.  Cardiovascular: Normal rate, regular rhythm and normal heart sounds.   Pulmonary/Chest: Effort normal and breath sounds normal.  Nursing note and vitals reviewed.   Urgent Care Course   Clinical Course     Procedures (including critical care time)  Labs Review Labs Reviewed - No data to display  Imaging Review No results found.   Visual Acuity Review  Right Eye Distance:   Left Eye Distance:   Bilateral Distance:    Right Eye Near:   Left Eye Near:    Bilateral Near:         MDM   1. Sore throat   2. Nasopharyngitis   3. Cough    Zpak as directed Tessalon Perles 200mg  po tid prn #30 Push po fluids, rest, tylenol and motrin otc prn as  directed for fever, arthralgias, and myalgias.  Follow up prn if sx's continue or persist.    Deatra Canter, FNP 11/14/16 1718

## 2017-12-05 ENCOUNTER — Encounter (HOSPITAL_COMMUNITY): Payer: Self-pay | Admitting: Emergency Medicine

## 2017-12-05 ENCOUNTER — Ambulatory Visit (HOSPITAL_COMMUNITY)
Admission: EM | Admit: 2017-12-05 | Discharge: 2017-12-05 | Disposition: A | Discharge status: Home / Self Care | Payer: Managed Care, Other (non HMO) | Attending: Family Medicine | Admitting: Family Medicine

## 2017-12-05 DIAGNOSIS — J111 Influenza due to unidentified influenza virus with other respiratory manifestations: Secondary | ICD-10-CM

## 2017-12-05 DIAGNOSIS — R69 Illness, unspecified: Secondary | ICD-10-CM

## 2017-12-05 MED ORDER — OSELTAMIVIR PHOSPHATE 75 MG PO CAPS
75.0000 mg | ORAL_CAPSULE | Freq: Two times a day (BID) | ORAL | 0 refills | Status: DC
Start: 2017-12-05 — End: 2024-08-30

## 2017-12-05 MED ORDER — HYDROCOD POLST-CPM POLST ER 10-8 MG/5ML PO SUER: 5.0000 mL | Freq: Two times a day (BID) | ORAL | 0 refills | Status: DC | PRN

## 2017-12-05 NOTE — ED Provider Notes (Signed)
[place]   [id number] 12/05/17 Arrival Time: 56   SUBJECTIVE:  Willie Bonilla is a 37 y.o. male who presents to the urgent care with complaint of cough, congestion, headache, chest pain with cough, sore throat, low grade temp, diarrhea, and body aches that started Saturday night  No vomiting.  History reviewed. No pertinent past medical history. No family history on file. Social History   Socioeconomic History  . Marital status: Married    Spouse name: Not on file  . Number of children: Not on file  . Years of education: Not on file  . Highest education level: Not on file  Social Needs  . Financial resource strain: Not on file  . Food insecurity - worry: Not on file  . Food insecurity - inability: Not on file  . Transportation needs - medical: Not on file  . Transportation needs - non-medical: Not on file  Occupational History  . Not on file  Tobacco Use  . Smoking status: Never Smoker  . Smokeless tobacco: Never Used  Substance and Sexual Activity  . Alcohol use: Yes    Comment: occasional  . Drug use: No  . Sexual activity: Not on file  Other Topics Concern  . Not on file  Social History Narrative  . Not on file   Current Meds  Medication Sig  . [DISCONTINUED] naproxen (NAPROSYN) 375 MG tablet Take 375 mg by mouth 2 (two) times daily with a meal.   No Known Allergies    ROS: As per HPI, remainder of ROS negative.   OBJECTIVE:   Vitals:   12/05/17 1632  BP: (!) 145/105  Pulse: (!) 109  Resp: 16  Temp: 99.9 F (37.7 C)  TempSrc: Temporal  SpO2: 98%  Weight: 240 lb (108.9 kg)  Height: 5' 6"  (1.676 m)     General appearance: alert; no distress Eyes: PERRL; EOMI; conjunctiva injected bilaterally HENT: normocephalic; atraumatic; TMs normal, canal normal, external ears normal without trauma; nasal mucosa normal; oral mucosa normal Neck: supple Lungs: Respiratory wheezes bilaterally Heart: regular rate and rhythm Back: no CVA  tenderness Extremities: no cyanosis or edema; symmetrical with no gross deformities Skin: warm and dry Neurologic: normal gait; grossly normal Psychological: alert and cooperative; normal mood and affect      Labs:  Results for orders placed or performed during the hospital encounter of 01/15/11  CBC  Result Value Ref Range   WBC 5.3 4.0 - 10.5 K/uL   RBC 5.57 4.22 - 5.81 MIL/uL   Hemoglobin 12.5 DELTA CHECK NOTED REPEATED TO VERIFY (L) 13.0 - 17.0 g/dL   HCT 38.6 (L) 39.0 - 52.0 %   MCV 69.3 (L) 78.0 - 100.0 fL   MCH 22.4 (L) 26.0 - 34.0 pg   MCHC 32.4 30.0 - 36.0 g/dL   RDW 14.9 11.5 - 15.5 %   Platelets 153 150 - 400 K/uL  Protime-INR  Result Value Ref Range   Prothrombin Time 14.1 11.6 - 15.2 seconds   INR 1.07 0.00 - 1.49  APTT  Result Value Ref Range   aPTT 33 24 - 37 seconds  Comprehensive metabolic panel  Result Value Ref Range   Sodium 136 135 - 145 mEq/L   Potassium 3.0 (L) 3.5 - 5.1 mEq/L   Chloride 103 96 - 112 mEq/L   CO2 27 19 - 32 mEq/L   Glucose, Bld 115 (H) 70 - 99 mg/dL   BUN 5 (L) 6 - 23 mg/dL   Creatinine, Ser 0.80  0.4 - 1.5 mg/dL   Calcium 7.9 (L) 8.4 - 10.5 mg/dL   Total Protein 6.1 6.0 - 8.3 g/dL   Albumin 3.4 (L) 3.5 - 5.2 g/dL   AST 54 (H) 0 - 37 U/L   ALT 33 0 - 53 U/L   Alkaline Phosphatase 15 (L) 39 - 117 U/L   Total Bilirubin 0.3 0.3 - 1.2 mg/dL   GFR calc non Af Amer >60 >60 mL/min   GFR calc Af Amer  >60 mL/min    >60        The eGFR has been calculated using the MDRD equation. This calculation has not been validated in all clinical situations. eGFR's persistently <60 mL/min signify possible Chronic Kidney Disease.  Lipid panel  Result Value Ref Range   Cholesterol  0 - 200 mg/dL    106        ATP III CLASSIFICATION:  <200     mg/dL   Desirable  200-239  mg/dL   Borderline High  >=240    mg/dL   High          Triglycerides 66 <150 mg/dL   HDL 38 (L) >39 mg/dL   Total CHOL/HDL Ratio 2.8 RATIO   VLDL 13 0 - 40 mg/dL    LDL Cholesterol  0 - 99 mg/dL    55        Total Cholesterol/HDL:CHD Risk Coronary Heart Disease Risk Table                     Men   Women  1/2 Average Risk   3.4   3.3  Average Risk       5.0   4.4  2 X Average Risk   9.6   7.1  3 X Average Risk  23.4   11.0        Use the calculated Patient Ratio above and the CHD Risk Table to determine the patient's CHD Risk.        ATP III CLASSIFICATION (LDL):  <100     mg/dL   Optimal  100-129  mg/dL   Near or Above                    Optimal  130-159  mg/dL   Borderline  160-189  mg/dL   High  >190     mg/dL   Very High  Cardiac panel(cret kin+cktot+mb+tropi)  Result Value Ref Range   Total CK 1,118 (H) 7 - 232 U/L   CK, MB (HH) 0.3 - 4.0 ng/mL    41.8 CRITICAL RESULT CALLED TO, READ BACK BY AND VERIFIED WITH: G.SERAFICA,RN 01/15/11 1202 BY BSLADE   Troponin I (HH) 0.00 - 0.06 ng/mL    4.14        POSSIBLE MYOCARDIAL ISCHEMIA. SERIAL TESTING RECOMMENDED. CRITICAL RESULT CALLED TO, READ BACK BY AND VERIFIED WITH: G.SERAFICA,RN 01/15/11 1202 BY BSLADE   Relative Index 3.7 (H) 0.0 - 2.5  Cardiac panel(cret kin+cktot+mb+tropi)  Result Value Ref Range   Total CK 1,139 (H) 7 - 232 U/L   CK, MB (HH) 0.3 - 4.0 ng/mL    56.5 CRITICAL VALUE NOTED.  VALUE IS CONSISTENT WITH PREVIOUSLY REPORTED AND CALLED VALUE.   Troponin I (HH) 0.00 - 0.06 ng/mL    6.41        POSSIBLE MYOCARDIAL ISCHEMIA. SERIAL TESTING RECOMMENDED. CRITICAL VALUE NOTED.  VALUE IS CONSISTENT WITH PREVIOUSLY REPORTED AND CALLED VALUE.   Relative Index 5.0 (H)  0.0 - 2.5  Hemoglobin A1c  Result Value Ref Range   Hgb A1c MFr Bld (H) <5.7 %    6.0 (NOTE)                                                                       According to the ADA Clinical Practice Recommendations for 2011, when HbA1c is used as a screening test:   >=6.5%   Diagnostic of Diabetes Mellitus           (if abnormal result  is confirmed)  5.7-6.4%   Increased risk of developing Diabetes Mellitus   References:Diagnosis and Classification of Diabetes Mellitus,Diabetes NLZJ,6734,19(FXTKW 1):S62-S69 and Standards of Medical Care in         Diabetes - 2011,Diabetes IOXB,3532,99  (Suppl 1):S11-S61.   Mean Plasma Glucose 126 (H) <117 mg/dL  CBC  Result Value Ref Range   WBC 4.7 4.0 - 10.5 K/uL   RBC 5.63 4.22 - 5.81 MIL/uL   Hemoglobin 12.5 (L) 13.0 - 17.0 g/dL   HCT 39.5 39.0 - 52.0 %   MCV 70.2 (L) 78.0 - 100.0 fL   MCH 22.2 (L) 26.0 - 34.0 pg   MCHC 31.6 30.0 - 36.0 g/dL   RDW 15.0 11.5 - 15.5 %   Platelets 167 150 - 400 K/uL  Comprehensive metabolic panel  Result Value Ref Range   Sodium 139 135 - 145 mEq/L   Potassium 3.8 DELTA CHECK NOTED 3.5 - 5.1 mEq/L   Chloride 104 96 - 112 mEq/L   CO2 28 19 - 32 mEq/L   Glucose, Bld 115 (H) 70 - 99 mg/dL   BUN 5 (L) 6 - 23 mg/dL   Creatinine, Ser 1.02 0.4 - 1.5 mg/dL   Calcium 8.3 (L) 8.4 - 10.5 mg/dL   Total Protein 6.1 6.0 - 8.3 g/dL   Albumin 3.3 (L) 3.5 - 5.2 g/dL   AST 70 (H) 0 - 37 U/L   ALT 30 0 - 53 U/L   Alkaline Phosphatase 15 (L) 39 - 117 U/L   Total Bilirubin 0.3 0.3 - 1.2 mg/dL   GFR calc non Af Amer >60 >60 mL/min   GFR calc Af Amer  >60 mL/min    >60        The eGFR has been calculated using the MDRD equation. This calculation has not been validated in all clinical situations. eGFR's persistently <60 mL/min signify possible Chronic Kidney Disease.  Cardiac panel(cret kin+cktot+mb+tropi)  Result Value Ref Range   Total CK 1,006 (H) 7 - 232 U/L   CK, MB (HH) 0.3 - 4.0 ng/mL    35.0 CRITICAL VALUE NOTED.  VALUE IS CONSISTENT WITH PREVIOUSLY REPORTED AND CALLED VALUE.   Troponin I (HH) 0.00 - 0.06 ng/mL    6.83        POSSIBLE MYOCARDIAL ISCHEMIA. SERIAL TESTING RECOMMENDED. CRITICAL VALUE NOTED.  VALUE IS CONSISTENT WITH PREVIOUSLY REPORTED AND CALLED VALUE.   Relative Index 3.5 (H) 0.0 - 2.5  Lactate dehydrogenase  Result Value Ref Range   LDH 219 94 - 250 U/L  Sedimentation rate  Result Value Ref Range     Sed Rate 10 0 - 16 mm/hr  Cardiac panel(cret kin+cktot+mb+tropi)  Result Value Ref Range  Total CK 695 (H) 7 - 232 U/L   CK, MB (HH) 0.3 - 4.0 ng/mL    17.2 CRITICAL VALUE NOTED.  VALUE IS CONSISTENT WITH PREVIOUSLY REPORTED AND CALLED VALUE.   Troponin I (HH) 0.00 - 0.06 ng/mL    4.10        POSSIBLE MYOCARDIAL ISCHEMIA. SERIAL TESTING RECOMMENDED. CRITICAL VALUE NOTED.  VALUE IS CONSISTENT WITH PREVIOUSLY REPORTED AND CALLED VALUE.   Relative Index 2.5 0.0 - 2.5  C-reactive protein  Result Value Ref Range   CRP 9.9 (H) <0.6 mg/dL  TSH  Result Value Ref Range   TSH 1.026 0.350 - 4.500 uIU/mL  Basic metabolic panel  Result Value Ref Range   Sodium 143 135 - 145 mEq/L   Potassium 3.8 3.5 - 5.1 mEq/L   Chloride 104 96 - 112 mEq/L   CO2 30 19 - 32 mEq/L   Glucose, Bld 102 (H) 70 - 99 mg/dL   BUN 7 6 - 23 mg/dL   Creatinine, Ser 1.14 0.4 - 1.5 mg/dL   Calcium 9.0 8.4 - 10.5 mg/dL   GFR calc non Af Amer >60 >60 mL/min   GFR calc Af Amer  >60 mL/min    >60        The eGFR has been calculated using the MDRD equation. This calculation has not been validated in all clinical situations. eGFR's persistently <60 mL/min signify possible Chronic Kidney Disease.  Aldolase  Result Value Ref Range   Aldolase 9.0 (H) <=8.1 U/L  I-STAT, chem 8  Result Value Ref Range   Sodium 139 135 - 145 mEq/L   Potassium 3.0 (L) 3.5 - 5.1 mEq/L   Chloride 99 96 - 112 mEq/L   BUN 5 (L) 6 - 23 mg/dL   Creatinine, Ser 0.9 0.4 - 1.5 mg/dL   Glucose, Bld 113 (H) 70 - 99 mg/dL   Calcium, Ion 1.06 (L) 1.12 - 1.32 mmol/L   TCO2 25 0 - 100 mmol/L   Hemoglobin 13.6 13.0 - 17.0 g/dL   HCT 40.0 39.0 - 52.0 %    Labs Reviewed - No data to display  No results found.     ASSESSMENT & PLAN:  1. Influenza-like illness     Meds ordered this encounter  Medications  . chlorpheniramine-HYDROcodone (TUSSIONEX PENNKINETIC ER) 10-8 MG/5ML SUER    Sig: Take 5 mLs by mouth every 12 (twelve) hours as  needed for cough.    Dispense:  100 mL    Refill:  0  . oseltamivir (TAMIFLU) 75 MG capsule    Sig: Take 1 capsule (75 mg total) by mouth every 12 (twelve) hours.    Dispense:  10 capsule    Refill:  0    Reviewed expectations re: course of current medical issues. Questions answered. Outlined signs and symptoms indicating need for more acute intervention. Patient verbalized understanding. After Visit Summary given.    Procedures:      Robyn Haber, MD 12/05/17 1700

## 2017-12-05 NOTE — Discharge Instructions (Signed)
Continue to force fluids

## 2017-12-05 NOTE — ED Triage Notes (Signed)
PT reports cough, congestion, headache, chest pain with cough, sore throat, low grade temp, diarrhea, and body aches that started Saturday.

## 2020-02-15 ENCOUNTER — Ambulatory Visit: Payer: Managed Care, Other (non HMO) | Attending: Internal Medicine

## 2020-02-15 DIAGNOSIS — Z23 Encounter for immunization: Secondary | ICD-10-CM

## 2020-03-10 ENCOUNTER — Ambulatory Visit: Payer: Managed Care, Other (non HMO) | Attending: Internal Medicine

## 2020-03-10 DIAGNOSIS — Z23 Encounter for immunization: Secondary | ICD-10-CM

## 2020-03-10 NOTE — Progress Notes (Signed)
   Covid-19 Vaccination Clinic  Name:  Willie Bonilla    MRN: [id number] DOB: 25-Jan-1981  03/10/2020  Mr. [name] was observed post Covid-19 immunization for 15 minutes without incident. He was provided with Vaccine Information Sheet and instruction to access the V-Safe system.   Mr. [name] was instructed to call 911 with any severe reactions post vaccine: Marland Kitchen Difficulty breathing  . Swelling of face and throat  . A fast heartbeat  . A bad rash all over body  . Dizziness and weakness   Immunizations Administered    Name Date Dose VIS Date Route   Pfizer COVID-19 Vaccine 03/10/2020  9:06 AM 0.3 mL 01/02/2019 Intramuscular   Manufacturer: [employer], [employer]   Lot: [id number]   NDC: [address]-2

## 2020-09-07 ENCOUNTER — Encounter (HOSPITAL_COMMUNITY): Payer: Self-pay | Admitting: Emergency Medicine

## 2020-09-07 ENCOUNTER — Ambulatory Visit (HOSPITAL_COMMUNITY)
Admission: EM | Admit: 2020-09-07 | Discharge: 2020-09-07 | Disposition: A | Discharge status: Home / Self Care | Payer: Managed Care, Other (non HMO)

## 2020-09-07 ENCOUNTER — Ambulatory Visit (INDEPENDENT_AMBULATORY_CARE_PROVIDER_SITE_OTHER): Payer: Managed Care, Other (non HMO)

## 2020-09-07 DIAGNOSIS — R059 Cough, unspecified: Secondary | ICD-10-CM

## 2020-09-07 MED ORDER — ALBUTEROL SULFATE HFA 108 (90 BASE) MCG/ACT IN AERS: 1.0000 | INHALATION_SPRAY | Freq: Four times a day (QID) | RESPIRATORY_TRACT | 0 refills | Status: AC | PRN

## 2020-09-07 MED ORDER — BENZONATATE 100 MG PO CAPS: 100.0000 mg | ORAL_CAPSULE | Freq: Three times a day (TID) | ORAL | 0 refills | Status: DC | PRN

## 2020-09-07 NOTE — Discharge Instructions (Addendum)
Your chest xray looks well today which is reassuring.  I have sent an inhaler you can use as needed for chest tightness.  Tessalon as needed for cough, you can continue with plain mucinex as needed.  If worsening- shortness of breath , pain with breathing, or difficulty breathing please return or follow with your primary care provider.

## 2020-09-07 NOTE — ED Triage Notes (Signed)
Presents with productive cough xs 1 week.

## 2020-09-07 NOTE — ED Provider Notes (Signed)
[facility]    CSN: [id number] Arrival date & time: 09/07/20  1134      History   Chief Complaint Chief Complaint  Patient presents with  . Cough    HPI Willie Bonilla is a 39 y.o. male.   Willie Bonilla presents with complaints of cough. Started over a week ago. Deep berathing will trigger the cough. Not worsening but not improving. Cough is productive of phlegm. Has been taking mucinex. Sometimes feels shortness of breath. Chest tightness. No sore throat or nasal drainage. No headache or body aches. No fevers. No gi symptoms. No known ill contacts. Has been vaccinated for covid-19. 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 He took 1 left over amoxicillin. No asthma or copd. Denies any previous similar. Doesn't smoke.    ROS per HPI, negative if not otherwise mentioned.      History reviewed. No pertinent past medical history.  There are no problems to display for this patient.   History reviewed. No pertinent surgical history.     Home Medications    Prior to Admission medications   Medication Sig Start Date End Date Taking? Authorizing Provider  metoprolol tartrate (LOPRESSOR) 50 MG tablet Take 50 mg by mouth 2 (two) times daily.   Yes [provider]  albuterol (PROAIR HFA) 108 (90 Base) MCG/ACT inhaler Inhale 1-2 puffs into the lungs every 6 (six) hours as needed for wheezing or shortness of breath (or chest tightness). 09/07/20   Georgetta Haber, NP  benzonatate (TESSALON) 100 MG capsule Take 1-2 capsules (100-200 mg total) by mouth 3 (three) times daily as needed for cough. 09/07/20   Georgetta Haber, NP  chlorpheniramine-HYDROcodone (TUSSIONEX PENNKINETIC ER) 10-8 MG/5ML SUER Take 5 mLs by mouth every 12 (twelve) hours as needed for cough. 12/05/17   Elvina Sidle, MD  omeprazole (PRILOSEC) 40 MG capsule Take 1 capsule (40 mg total) by mouth daily. 08/06/14   Rodolph Bong, MD  oseltamivir (TAMIFLU) 75 MG capsule Take 1 capsule (75 mg total) by mouth every 12  (twelve) hours. 12/05/17   Elvina Sidle, MD    Family History History reviewed. No pertinent family history.  Social History Social History   Tobacco Use  . Smoking status: Never Smoker  . Smokeless tobacco: Never Used  Substance Use Topics  . Alcohol use: Yes    Comment: occasional  . Drug use: No     Allergies   Patient has no known allergies.   Review of Systems Review of Systems   Physical Exam Triage Vital Signs ED Triage Vitals  Enc Vitals Group     BP 09/07/20 1258 (!) 162/112     Pulse Rate 09/07/20 1258 92     Resp 09/07/20 1258 18     Temp 09/07/20 1258 99.1 F (37.3 C)     Temp Source 09/07/20 1258 Oral     SpO2 09/07/20 1258 98 %     Weight --      Height --      Head Circumference --      Peak Flow --      Pain Score 09/07/20 1257 0     Pain Loc --      Pain Edu? --      Excl. in GC? --    No data found.  Updated Vital Signs BP (!) 162/112 (BP Location: Left Arm)   Pulse 92   Temp 99.1 F (37.3 C) (Oral)   Resp 18   SpO2 98%  Physical Exam Constitutional:      Appearance: He is well-developed.  HENT:     Mouth/Throat:     Mouth: Mucous membranes are moist.  Eyes:     Pupils: Pupils are equal, round, and reactive to light.  Cardiovascular:     Rate and Rhythm: Normal rate and regular rhythm.  Pulmonary:     Effort: Pulmonary effort is normal.     Breath sounds: Normal breath sounds.     Comments: No cough throughout exam Skin:    General: Skin is warm and dry.  Neurological:     Mental Status: He is alert and oriented to person, place, and time.      UC Treatments / Results  Labs (all labs ordered are listed, but only abnormal results are displayed) Labs Reviewed - No data to display  EKG   Radiology DG Chest 2 View  Result Date: 09/07/2020 CLINICAL DATA:  Cough. EXAM: CHEST - 2 VIEW COMPARISON:  August 06, 2014 FINDINGS: Mild cardiomegaly. The hila, mediastinum, lungs, and pleura are otherwise  unremarkable. IMPRESSION: No active cardiopulmonary disease. Electronically Signed   By: Gerome Sam III M.D   On: 09/07/2020 13:30    Procedures Procedures (including critical care time)  Medications Ordered in UC Medications - No data to display  Initial Impression / Assessment and Plan / UC Course  I have reviewed the triage vital signs and the nursing notes.  Pertinent labs & imaging results that were available during my care of the patient were reviewed by me and considered in my medical decision making (see chart for details).     Non toxic. No work of breathing here today. Vitals stable. Chest xray without acute findings today which is reassuring. No fevers. Supportive cares recommended. Return precautions provided. Patient verbalized understanding and agreeable to plan.   Final Clinical Impressions(s) / UC Diagnoses   Final diagnoses:  Cough     Discharge Instructions     Your chest xray looks well today which is reassuring.  I have sent an inhaler you can use as needed for chest tightness.  Tessalon as needed for cough, you can continue with plain mucinex as needed.  If worsening- shortness of breath , pain with breathing, or difficulty breathing please return or follow with your primary care provider.      ED Prescriptions    Medication Sig Dispense Auth. Provider   benzonatate (TESSALON) 100 MG capsule Take 1-2 capsules (100-200 mg total) by mouth 3 (three) times daily as needed for cough. 21 capsule Linus Mako B, NP   albuterol (PROAIR HFA) 108 (90 Base) MCG/ACT inhaler Inhale 1-2 puffs into the lungs every 6 (six) hours as needed for wheezing or shortness of breath (or chest tightness). 1 each Georgetta Haber, NP     PDMP not reviewed this encounter.   Georgetta Haber, NP 09/07/20 [phone]

## 2020-10-25 ENCOUNTER — Ambulatory Visit: Payer: Managed Care, Other (non HMO) | Attending: Internal Medicine

## 2020-10-25 DIAGNOSIS — Z23 Encounter for immunization: Secondary | ICD-10-CM

## 2020-10-25 NOTE — Progress Notes (Signed)
   Covid-19 Vaccination Clinic  Name:  Willie Bonilla    MRN: [id number] DOB: Jun 09, 1981  10/25/2020  [name] was observed post Covid-19 immunization for 15 minutes without incident. He was provided with Vaccine Information Sheet and instruction to access the V-Safe system.   [name] was instructed to call 911 with any severe reactions post vaccine: Marland Kitchen Difficulty breathing  . Swelling of face and throat  . A fast heartbeat  . A bad rash all over body  . Dizziness and weakness   Immunizations Administered    Name Date Dose VIS Date Route   Pfizer COVID-19 Vaccine 10/25/2020  9:37 AM 0.3 mL 08/27/2020 Intramuscular   Manufacturer: [employer], [employer]   Lot: [id number]   NDC: [address]-6

## 2022-01-20 IMAGING — DX DG CHEST 2V
2 series · 2 of 2 positions shown · non-contrast
Comparison: August 06, 2014

CLINICAL DATA: Cough.

EXAM:
CHEST - 2 VIEW

[chest pa]
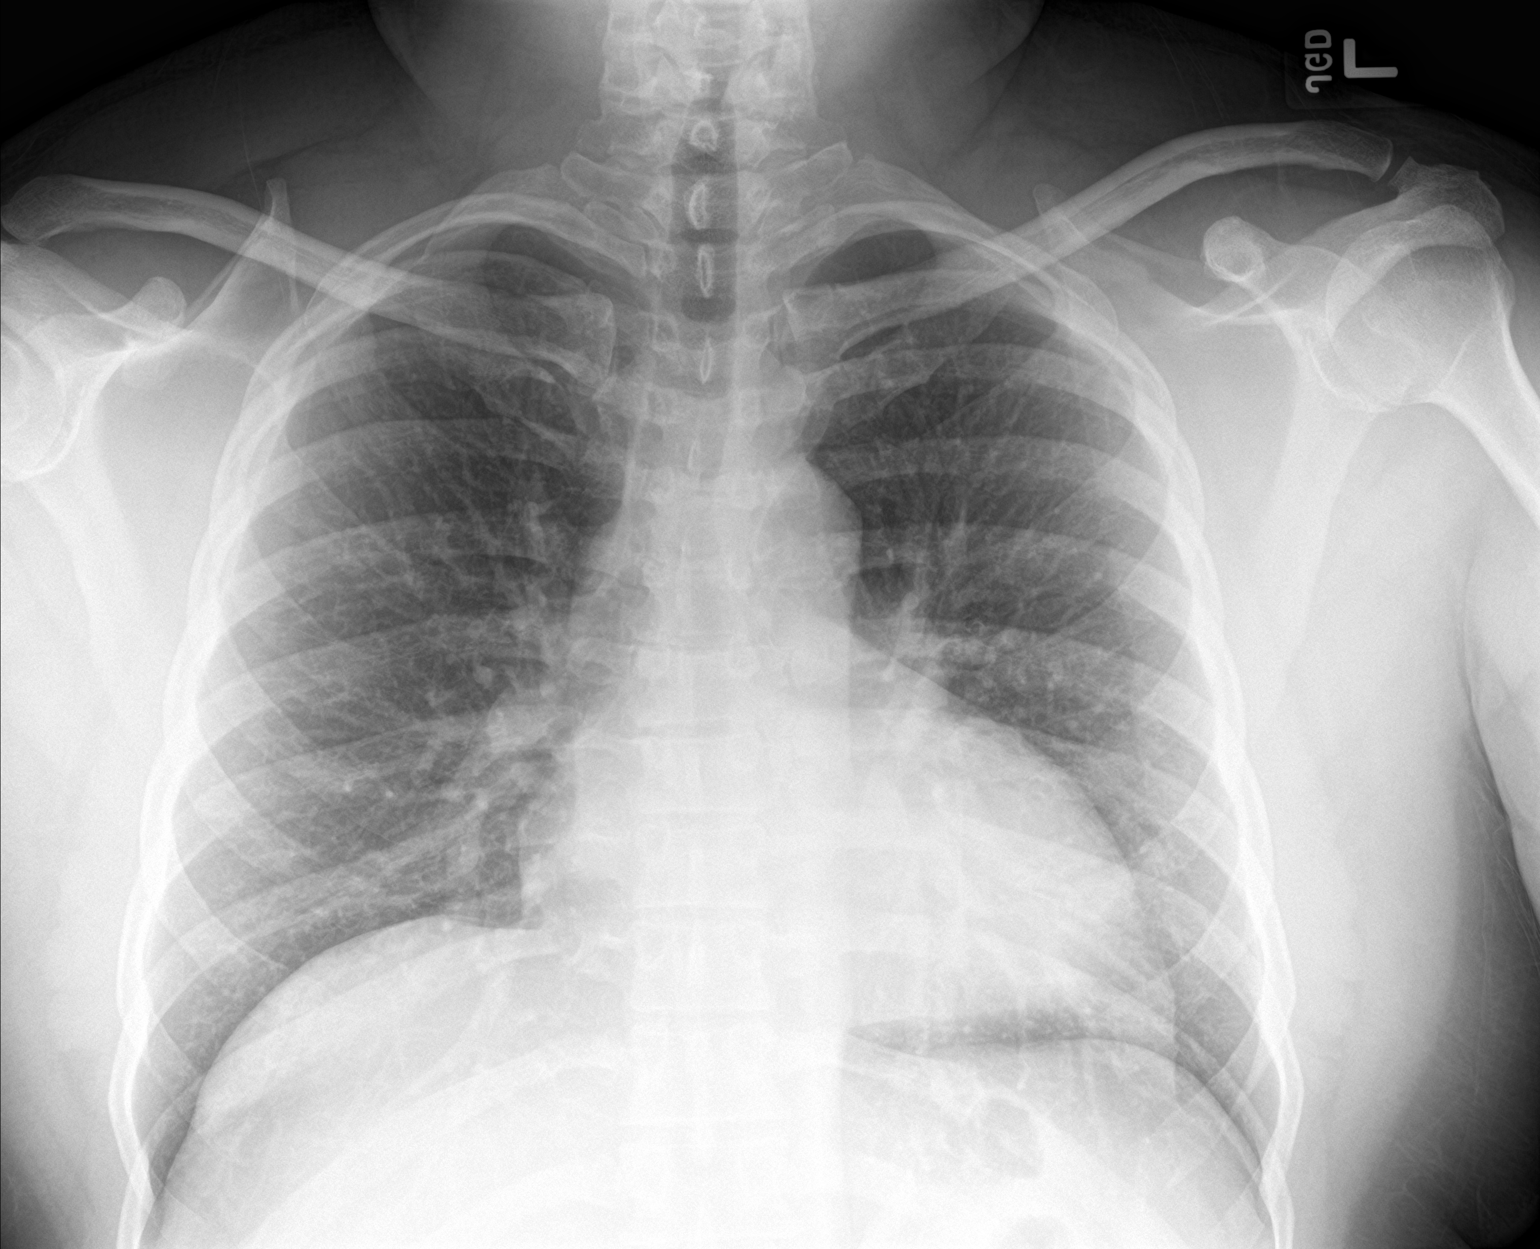

[chest lat]
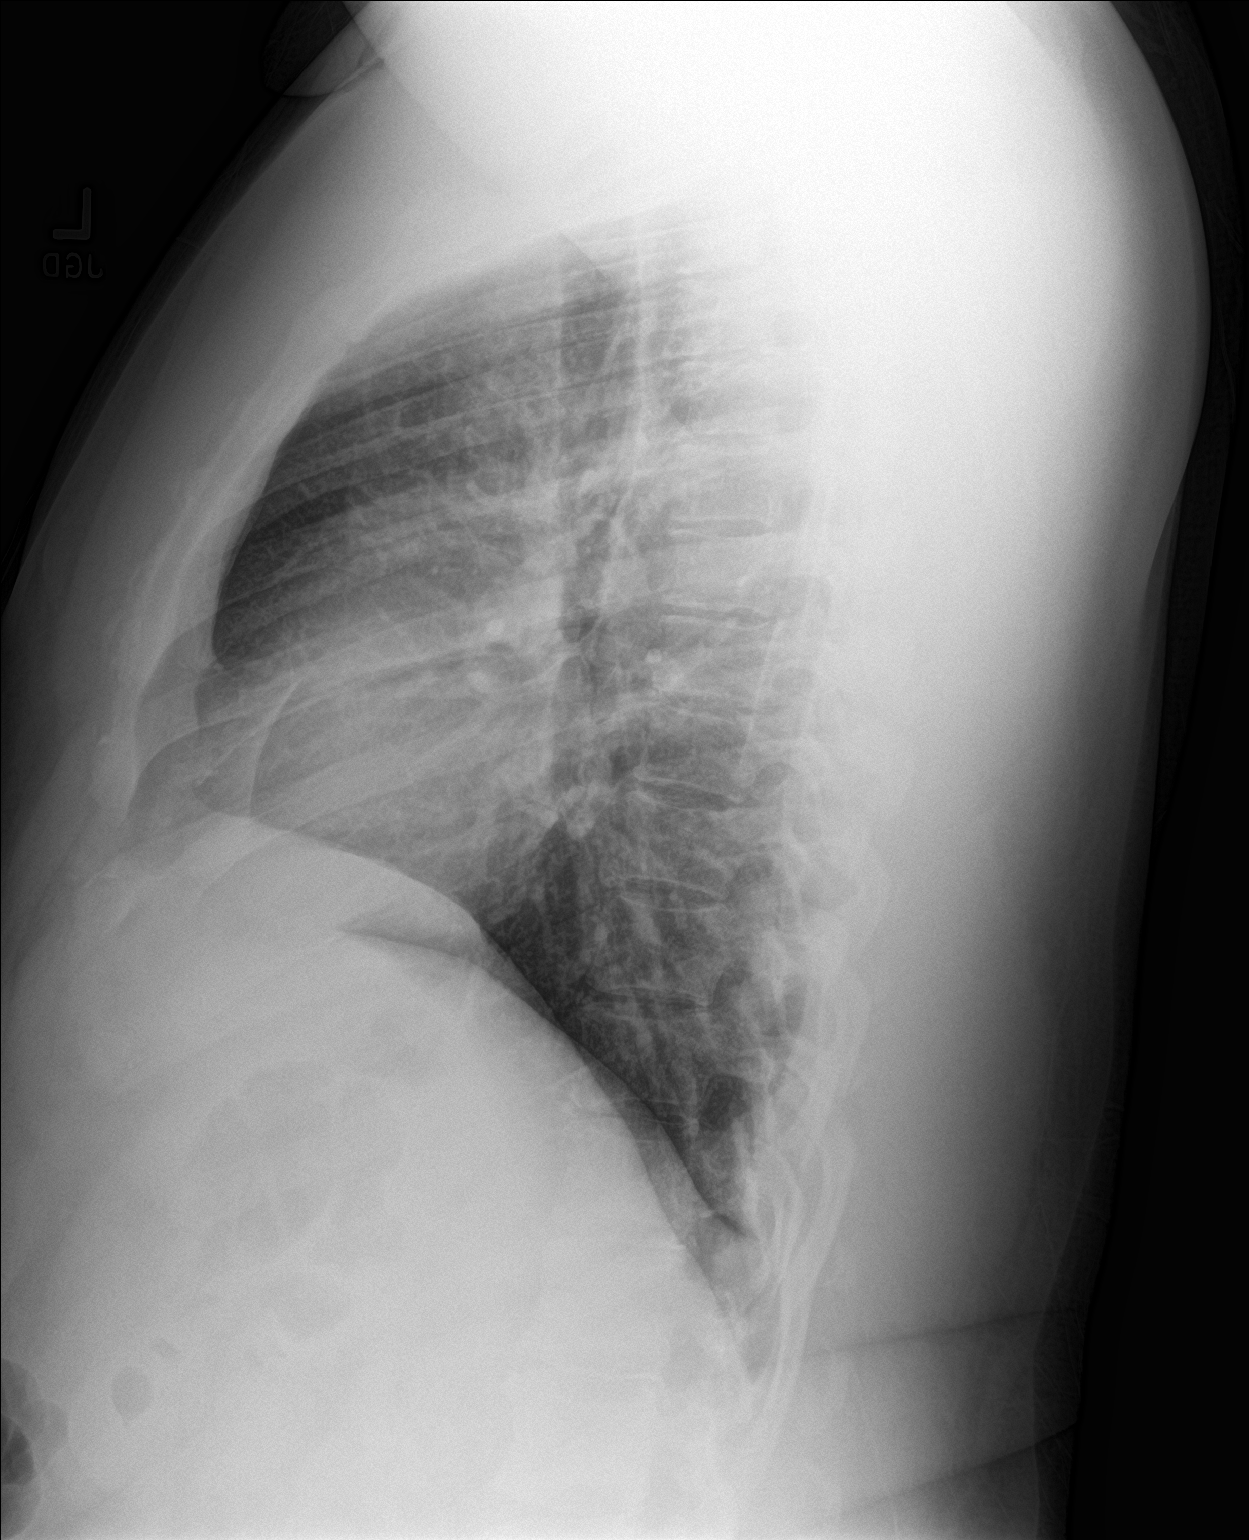

[2 of 2 positions shown; findings below may reference images not displayed]

FINDINGS: Mild cardiomegaly. The hila, mediastinum, lungs, and pleura are
otherwise unremarkable.
IMPRESSION: No active cardiopulmonary disease.

## 2024-08-30 ENCOUNTER — Encounter: Payer: Self-pay | Admitting: *Deleted

## 2024-08-30 ENCOUNTER — Other Ambulatory Visit: Payer: Self-pay

## 2024-08-30 ENCOUNTER — Ambulatory Visit
Admission: EM | Admit: 2024-08-30 | Discharge: 2024-08-30 | Disposition: A | Discharge status: Home / Self Care | Attending: Emergency Medicine | Admitting: Emergency Medicine

## 2024-08-30 DIAGNOSIS — T148XXA Other injury of unspecified body region, initial encounter: Secondary | ICD-10-CM

## 2024-08-30 DIAGNOSIS — M25521 Pain in right elbow: Secondary | ICD-10-CM | POA: Diagnosis not present

## 2024-08-30 NOTE — Discharge Instructions (Addendum)
 Rest - try to avoid heavy lifting and high impact activity. If it causes pain, don't do it! Ice - apply for 20 minutes a few times daily Compression - use ace wrap for support Elevation - prop up on a pillow  Continue ibuprofen for pain  If after 1 week you have no improvement, please return or follow up with the orthopedic specialist [facility])

## 2024-08-30 NOTE — ED Triage Notes (Signed)
 Pt reports right elbow pain, onset Sunday. Denies known injury. Pain is worse with movement. Taking Motrin with some relief. States he has been working out more lately with weights.

## 2024-08-30 NOTE — ED Provider Notes (Signed)
 [facility]    CSN: [id number] Arrival date & time: 08/30/24  1004      History   Chief Complaint Chief Complaint  Patient presents with   Elbow Pain    HPI Willie Bonilla is a 43 y.o. male.   4-5 day history of right elbow pain with certain movements.  Started after he began weightlifting. Has used ibuprofen that helps temporarily Denies any direct trauma or falls.  He is fairly new to weight training  No prior arm or elbow injury Denies numbness/tingling or weakness   History reviewed. No pertinent past medical history.  There are no active problems to display for this patient.   History reviewed. No pertinent surgical history.     Home Medications    Prior to Admission medications   Medication Sig Start Date End Date Taking? Authorizing Provider  albuterol  (PROAIR  HFA) 108 (90 Base) MCG/ACT inhaler Inhale 1-2 puffs into the lungs every 6 (six) hours as needed for wheezing or shortness of breath (or chest tightness). 09/07/20   Burky, Natalie B, NP  metoprolol tartrate (LOPRESSOR) 50 MG tablet Take 50 mg by mouth 2 (two) times daily.    [provider]    Family History History reviewed. No pertinent family history.  Social History Social History   Tobacco Use   Smoking status: Never   Smokeless tobacco: Never  Vaping Use   Vaping status: Never Used  Substance Use Topics   Alcohol use: Yes    Comment: 1 x week   Drug use: No     Allergies   Patient has no known allergies.   Review of Systems Review of Systems As per HPI   Physical Exam Triage Vital Signs ED Triage Vitals  Encounter Vitals Group     BP 08/30/24 1055 109/71     Girls Systolic BP Percentile --      Girls Diastolic BP Percentile --      Boys Systolic BP Percentile --      Boys Diastolic BP Percentile --      Pulse Rate 08/30/24 1055 88     Resp 08/30/24 1055 18     Temp 08/30/24 1055 98.2 F (36.8 C)     Temp Source 08/30/24 1055 Oral      SpO2 08/30/24 1055 94 %     Weight --      Height --      Head Circumference --      Peak Flow --      Pain Score 08/30/24 1052 6     Pain Loc --      Pain Education --      Exclude from Growth Chart --    No data found.  Updated Vital Signs BP 109/71 (BP Location: Left Arm)   Pulse 88   Temp 98.2 F (36.8 C) (Oral)   Resp 18   SpO2 94%    Physical Exam Vitals and nursing note reviewed.  Constitutional:      General: He is not in acute distress. HENT:     Mouth/Throat:     Pharynx: Oropharynx is clear.  Cardiovascular:     Rate and Rhythm: Normal rate and regular rhythm.     Pulses: Normal pulses.  Pulmonary:     Effort: Pulmonary effort is normal.  Musculoskeletal:     Right upper arm: Tenderness present.     Right elbow: No swelling, deformity or effusion. No tenderness.     Comments: Muscular tenderness of  right distal tricep. Pain elicited with external rotation.  No bony tenderness of elbow, no swelling or effusion.  No obvious deformity.  There are no skin changes.  Strength 5/5.  Radial pulse 2+.  Distal sensation intact, cap refill is < 2 seconds  Skin:    General: Skin is warm and dry.     Capillary Refill: Capillary refill takes less than 2 seconds.  Neurological:     Mental Status: He is alert and oriented to person, place, and time.     UC Treatments / Results  Labs (all labs ordered are listed, but only abnormal results are displayed) Labs Reviewed - No data to display  EKG  Radiology No results found.  Procedures Procedures  Medications Ordered in UC Medications - No data to display  Initial Impression / Assessment and Plan / UC Course  I have reviewed the triage vital signs and the nursing notes.  Pertinent labs & imaging results that were available during my care of the patient were reviewed by me and considered in my medical decision making (see chart for details).  Muscular tenderness of right arm.  No direct trauma or bony  tenderness to warrant x-ray imaging at this time.  Discussed supportive care, RICE therapy.  Ace wrap applied.  Continue pain control with ibuprofen.  Monitor symptoms, return if needed or see orthopedics.  Patient is agreeable with plan, no questions  Final Clinical Impressions(s) / UC Diagnoses   Final diagnoses:  Right elbow pain  Muscle strain     Discharge Instructions      Rest - try to avoid heavy lifting and high impact activity. If it causes pain, don't do it! Ice - apply for 20 minutes a few times daily Compression - use ace wrap for support Elevation - prop up on a pillow  Continue ibuprofen for pain  If after 1 week you have no improvement, please return or follow up with the orthopedic specialist [facility])     ED Prescriptions   None    PDMP not reviewed this encounter.   Jeryl Stabs, PA-C 08/30/24 1311

## 2024-11-28 ENCOUNTER — Encounter (HOSPITAL_BASED_OUTPATIENT_CLINIC_OR_DEPARTMENT_OTHER): Payer: Self-pay | Admitting: Pulmonary Disease

## 2024-11-28 DIAGNOSIS — G4700 Insomnia, unspecified: Secondary | ICD-10-CM

## 2024-12-25 ENCOUNTER — Ambulatory Visit (HOSPITAL_BASED_OUTPATIENT_CLINIC_OR_DEPARTMENT_OTHER): Admitting: Pulmonary Disease
